# Patient Record
Sex: Male | Born: 1990 | Race: Black or African American | Hispanic: No | Marital: Single | Smoking: Light tobacco smoker
Health system: Southern US, Community
[De-identification: ages and names within clinical notes are randomized; demographics above are authoritative.]

---
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
## 2011-02-18 ENCOUNTER — Emergency Department (HOSPITAL_COMMUNITY)
Admission: EM | Admit: 2011-02-18 | Discharge: 2011-02-18 | Disposition: A | Discharge status: Home / Self Care | Payer: Self-pay | Attending: Emergency Medicine | Admitting: Emergency Medicine

## 2011-02-18 ENCOUNTER — Emergency Department (HOSPITAL_COMMUNITY): Payer: Self-pay

## 2011-02-18 DIAGNOSIS — R0789 Other chest pain: Secondary | ICD-10-CM | POA: Insufficient documentation

## 2012-08-20 ENCOUNTER — Emergency Department (HOSPITAL_COMMUNITY)
Admission: EM | Admit: 2012-08-20 | Discharge: 2012-08-21 | Disposition: A | Discharge status: Home / Self Care | Payer: Self-pay | Attending: Emergency Medicine | Admitting: Emergency Medicine

## 2012-08-20 ENCOUNTER — Emergency Department (HOSPITAL_COMMUNITY): Payer: Self-pay

## 2012-08-20 ENCOUNTER — Encounter (HOSPITAL_COMMUNITY): Payer: Self-pay

## 2012-08-20 DIAGNOSIS — R079 Chest pain, unspecified: Secondary | ICD-10-CM | POA: Insufficient documentation

## 2012-08-20 NOTE — ED Notes (Signed)
Pt states that he started having sudden onset of chest pain yesterday after work, describes it as chest tightness accompanied by inability to take deep breath, he states that the pain worsens with deep inspiration, Pt states that he also had this pain on Sunday while he was at the gym playing basketball and states "I felt my chest get tight and my heart racing and I had to stop because I couldn't take deep breaths."  Pt states that he has had this type of pain before and was seen a couple of years ago for it but they didn't find anything however he states that lately the pain has been happening everyday. Pt works for a [employer] but states that the pain doesn't feel like muscle pain.

## 2012-08-20 NOTE — ED Provider Notes (Signed)
History    21yM with CP. Intermittent for past week but worse today. Sharp/tight. Lasts seconds to up to a couple minutes. Worse with deep breaths. Does not radiate. No SOB. No n/v, palpitations or diaphoresis. No fever or chills. No cough. No unusual leg pain or swelling.   CSN: [id number]  Arrival date & time 08/20/12  2037   First MD Initiated Contact with Patient 08/20/12 2232      Chief Complaint  Patient presents with  . Chest Pain    (Consider location/radiation/quality/duration/timing/severity/associated sxs/prior treatment) HPI  History reviewed. No pertinent past medical history.  History reviewed. No pertinent past surgical history.  History reviewed. No pertinent family history.  History  Substance Use Topics  . Smoking status: Never Smoker   . Smokeless tobacco: Not on file  . Alcohol Use: Yes      Review of Systems   Review of symptoms negative unless otherwise noted in HPI.   Allergies  Review of patient's allergies indicates no known allergies.  Home Medications  No current outpatient prescriptions on file.  BP 137/63  Pulse 56  Temp 98.1 F (36.7 C) (Oral)  Resp 18  Ht 6\' 3"  (1.905 m)  SpO2 98%  Physical Exam  Nursing note and vitals reviewed. Constitutional: He appears well-developed and well-nourished. No distress.       Laying in bed. NAd.   HENT:  Head: Normocephalic and atraumatic.  Eyes: Conjunctivae normal are normal. Right eye exhibits no discharge. Left eye exhibits no discharge.  Neck: Neck supple.  Cardiovascular: Normal rate, regular rhythm and normal heart sounds.  Exam reveals no gallop and no friction rub.   No murmur heard. Pulmonary/Chest: Effort normal and breath sounds normal. No respiratory distress. He exhibits no tenderness.  Abdominal: Soft. He exhibits no distension. There is no tenderness.  Musculoskeletal: He exhibits no edema and no tenderness.       Lower extremities symmetric as compared to each other. No  calf tenderness. Negative Homan's. No palpable cords.   Neurological: He is alert.  Skin: Skin is warm and dry.  Psychiatric: He has a normal mood and affect. His behavior is normal. Thought content normal.    ED Course  Procedures (including critical care time)  Labs Reviewed - No data to display Dg Chest 2 View  08/20/2012  *RADIOLOGY REPORT*  Clinical Data: Chest pain beginning 08/17/2012.  CHEST - 2 VIEW  Comparison: PA and lateral chest 02/18/2011.  Findings: Lungs are clear.  Heart size is normal.  No pneumothorax or pleural fluid.  IMPRESSION: Negative chest.   Original Report Authenticated By: Bernadene Bell. D'ALESSIO, M.D.    EKG:  Rhythm: Vent. rate 61 BPM PR interval 144 ms QRS duration 86 ms QT/QTc 428/431 ms Axis: normal ST segments: diffuse STE, likely BER.  1. Chest pain       MDM  21yM with CP. Etiology unclear but doubt emergent. PERC negative. CXR clear. Doubt ACS. EKG with diffuse STE. Suspect BER. Consider pericarditis but doubt. Pt instructed to take NSAIDs for pain. Return precautions discussed. Outpt FU.         Raeford Razor, MD 08/23/12 2348

## 2013-08-30 ENCOUNTER — Encounter (HOSPITAL_COMMUNITY): Payer: Self-pay | Admitting: Emergency Medicine

## 2013-08-30 ENCOUNTER — Emergency Department (HOSPITAL_COMMUNITY)
Admission: EM | Admit: 2013-08-30 | Discharge: 2013-08-30 | Disposition: A | Discharge status: Home / Self Care | Payer: No Typology Code available for payment source | Attending: Emergency Medicine | Admitting: Emergency Medicine

## 2013-08-30 DIAGNOSIS — Y9389 Activity, other specified: Secondary | ICD-10-CM | POA: Insufficient documentation

## 2013-08-30 DIAGNOSIS — Y92410 Unspecified street and highway as the place of occurrence of the external cause: Secondary | ICD-10-CM | POA: Insufficient documentation

## 2013-08-30 DIAGNOSIS — Z043 Encounter for examination and observation following other accident: Secondary | ICD-10-CM | POA: Insufficient documentation

## 2013-08-30 MED ORDER — NAPROXEN 500 MG PO TABS: 500.0000 mg | ORAL_TABLET | Freq: Two times a day (BID) | ORAL | Status: DC

## 2013-08-30 MED ORDER — NAPROXEN 500 MG PO TABS
500.0000 mg | ORAL_TABLET | Freq: Once | ORAL | Status: AC
  Administered 2013-08-30: 500 mg via ORAL
  Filled 2013-08-30: qty 1

## 2013-08-30 MED ORDER — CYCLOBENZAPRINE HCL 10 MG PO TABS
10.0000 mg | ORAL_TABLET | Freq: Once | ORAL | Status: AC
  Administered 2013-08-30: 10 mg via ORAL
  Filled 2013-08-30: qty 1

## 2013-08-30 MED ORDER — CYCLOBENZAPRINE HCL 10 MG PO TABS: 10.0000 mg | ORAL_TABLET | Freq: Two times a day (BID) | ORAL | Status: DC | PRN
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
## 2013-08-30 NOTE — ED Provider Notes (Signed)
Medical screening examination/treatment/procedure(s) were performed by non-physician practitioner and as supervising physician I was immediately available for consultation/collaboration.  EKG Interpretation   None         Arieliz Latino, MD 08/30/13 2234 
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
## 2014-09-03 ENCOUNTER — Emergency Department (HOSPITAL_COMMUNITY): Payer: No Typology Code available for payment source

## 2014-09-03 ENCOUNTER — Emergency Department (HOSPITAL_COMMUNITY)
Admission: EM | Admit: 2014-09-03 | Discharge: 2014-09-03 | Disposition: A | Discharge status: Home / Self Care | Payer: No Typology Code available for payment source | Attending: Emergency Medicine | Admitting: Emergency Medicine

## 2014-09-03 ENCOUNTER — Encounter (HOSPITAL_COMMUNITY): Payer: Self-pay | Admitting: Emergency Medicine

## 2014-09-03 DIAGNOSIS — X58XXXA Exposure to other specified factors, initial encounter: Secondary | ICD-10-CM | POA: Insufficient documentation

## 2014-09-03 DIAGNOSIS — Y998 Other external cause status: Secondary | ICD-10-CM | POA: Insufficient documentation

## 2014-09-03 DIAGNOSIS — S93401A Sprain of unspecified ligament of right ankle, initial encounter: Secondary | ICD-10-CM

## 2014-09-03 DIAGNOSIS — Y92310 Basketball court as the place of occurrence of the external cause: Secondary | ICD-10-CM | POA: Insufficient documentation

## 2014-09-03 DIAGNOSIS — Z791 Long term (current) use of non-steroidal anti-inflammatories (NSAID): Secondary | ICD-10-CM | POA: Insufficient documentation

## 2014-09-03 DIAGNOSIS — Y9367 Activity, basketball: Secondary | ICD-10-CM | POA: Insufficient documentation

## 2014-09-03 MED ORDER — CYCLOBENZAPRINE HCL 10 MG PO TABS: 10.0000 mg | ORAL_TABLET | Freq: Two times a day (BID) | ORAL | Status: DC | PRN

## 2014-09-03 MED ORDER — IBUPROFEN 200 MG PO TABS: 800.0000 mg | ORAL_TABLET | Freq: Four times a day (QID) | ORAL | Status: DC | PRN

## 2014-09-03 MED ORDER — IBUPROFEN 800 MG PO TABS
800.0000 mg | ORAL_TABLET | Freq: Once | ORAL | Status: AC
  Administered 2014-09-03: 800 mg via ORAL
  Filled 2014-09-03: qty 1

## 2014-09-03 NOTE — ED Provider Notes (Signed)
CSN: [id number]     Arrival date & time 09/03/14  1242 History  This chart was scribed for non-physician practitioner, Fayrene HelperBowie Kanan Sobek, PA-C working with Flint MelterElliott L Wentz, MD by Luisa DagoPriscilla Tutu, ED scribe. This patient was seen in room WTR9/WTR9 and the patient's care was started at 2:10 PM.    Chief Complaint  Patient presents with  . Ankle Pain   The history is provided by the patient. No language interpreter was used.   HPI Comments: Jay Keith is a [age] y.o. male who presents to the Emergency Department complaining of sudden onset worsening right ankle pain. Pt states that he was playing basketball at 6 am when he inverted his right ankle when he was coming down from a jump shot. He states that the pain is exacerbated by bearing weight. He reports applying warm and cold compresses to the area, as well as taking Tylenol with no extended relief of his pain. Pt endorses previous ankle sprains but he has never had to get evaluated by a provider. Denies any hip pain, radiating pain, paraesthesia, numbness, or knee pain.    History reviewed. No pertinent past medical history. History reviewed. No pertinent past surgical history. No family history on file. History  Substance Use Topics  . Smoking status: Never Smoker   . Smokeless tobacco: Not on file  . Alcohol Use: Yes    Review of Systems  Constitutional: Negative for fever and chills.  HENT: Negative for congestion, ear discharge and sinus pressure.   Eyes: Negative for discharge.  Respiratory: Negative for cough.   Cardiovascular: Negative for chest pain.  Gastrointestinal: Negative for abdominal pain and diarrhea.  Genitourinary: Negative for frequency and hematuria.  Musculoskeletal: Positive for joint swelling and arthralgias. Negative for myalgias and back pain.  Skin: Negative for rash.  Neurological: Negative for seizures, weakness, numbness and headaches.  Psychiatric/Behavioral: Negative for hallucinations.   Allergies  Review of  patient's allergies indicates no known allergies.  Home Medications   Prior to Admission medications   Medication Sig Start Date End Date Taking? Authorizing Provider  cyclobenzaprine (FLEXERIL) 10 MG tablet Take 1 tablet (10 mg total) by mouth 2 (two) times daily as needed for muscle spasms. 08/30/13   Kaitlyn Szekalski, PA-C  naproxen (NAPROSYN) 500 MG tablet Take 1 tablet (500 mg total) by mouth 2 (two) times daily with a meal. 08/30/13   Kaitlyn Szekalski, PA-C   BP 140/75 mmHg  Pulse 62  Temp(Src) 98.6 F (37 C) (Oral)  Resp 16  SpO2 100%  Physical Exam  Constitutional: He appears well-developed and well-nourished. No distress.  HENT:  Head: Normocephalic and atraumatic.  Right Ear: External ear normal.  Left Ear: External ear normal.  Nose: Nose normal.  Mouth/Throat: Oropharynx is clear and moist.  Eyes: Conjunctivae are normal. Pupils are equal, round, and reactive to light. Right eye exhibits no discharge. Left eye exhibits no discharge.  Neck: Normal range of motion. Neck supple. No thyromegaly present.  Cardiovascular: Normal rate, regular rhythm and normal heart sounds.  Exam reveals no gallop and no friction rub.   No murmur heard. Pulmonary/Chest: Effort normal and breath sounds normal. No respiratory distress.  Abdominal: Soft. He exhibits no distension. There is no tenderness.  Musculoskeletal: He exhibits edema and tenderness.  Edema on the lateral, medial, and anterior aspect of his right ankle. No tenderness of calcaneous.  Lateral and medial malleoli are tender to palpation. Knee with normal extension and flexion.  Equal sensation bilaterally.  Lymphadenopathy:  He has no cervical adenopathy.  Neurological: He is alert. No cranial nerve deficit.  Skin: Skin is warm and dry.  Psychiatric: He has a normal mood and affect. His behavior is normal. Thought content normal.  Nursing note and vitals reviewed.   ED Course  Procedures (including critical care  time)  DIAGNOSTIC STUDIES: Oxygen Saturation is 100% on RA, normal by my interpretation.    COORDINATION OF CARE: 2:17 PM- Will order Ibuprofen for pain control. Pt will pt d/c with a right ankle brace and crutches for comfort. Advised pt to keep the ankle brace on and to elevate the affected ankle to reduce swelling. Also advised pt that if the pain persists after Monday, he should follow up with ortho on call. Pt advised of plan for treatment and pt agrees.  Imaging Review Dg Ankle Complete Right  09/03/2014   CLINICAL DATA:  Inversion injury while playing basketball with persistent lateral pain, initial encounter  EXAM: RIGHT ANKLE - COMPLETE 3+ VIEW  COMPARISON:  None.  FINDINGS: Considerable soft tissue swelling is noted particularly laterally. No acute fracture or dislocation is noted. No other focal abnormality is seen.  IMPRESSION: Soft tissue swelling without acute bony abnormality.   Electronically Signed   By: Alcide CleverMark  Lukens M.D.   On: 09/03/2014 13:18    MDM   Final diagnoses:  Right ankle sprain, initial encounter    BP 140/75 mmHg  Pulse 62  Temp(Src) 98.6 F (37 C) (Oral)  Resp 16  SpO2 100%  I have reviewed nursing notes and vital signs. I personally reviewed the imaging tests through PACS system  I reviewed available ER/hospitalization records thought the EMR   I personally performed the services described in this documentation, which was scribed in my presence. The recorded information has been reviewed and is accurate.    Fayrene HelperBowie Raybon Conard, PA-C 09/03/14 1420  Flint MelterElliott L Wentz, MD 09/03/14 [phone]

## 2014-09-03 NOTE — Discharge Instructions (Signed)

## 2014-09-03 NOTE — ED Notes (Signed)
Pt alert, nad, resp even unlabored, skin pwd, denies needs, ambulates to discharge 

## 2014-09-03 NOTE — ED Notes (Signed)
Pt reports turning right ankle this am playing basketball. Pt has iced ankle most of the morning. Swelling and bruising noted to right ankle and pt unable to ambulate.

## 2015-03-24 ENCOUNTER — Emergency Department (HOSPITAL_COMMUNITY)
Admission: EM | Admit: 2015-03-24 | Discharge: 2015-03-25 | Disposition: A | Discharge status: Home / Self Care | Payer: Self-pay | Attending: Emergency Medicine | Admitting: Emergency Medicine

## 2015-03-24 ENCOUNTER — Encounter (HOSPITAL_COMMUNITY): Payer: Self-pay | Admitting: *Deleted

## 2015-03-24 DIAGNOSIS — J0100 Acute maxillary sinusitis, unspecified: Secondary | ICD-10-CM | POA: Insufficient documentation

## 2015-03-24 MED ORDER — METOCLOPRAMIDE HCL 5 MG/ML IJ SOLN
10.0000 mg | Freq: Once | INTRAMUSCULAR | Status: AC
  Administered 2015-03-24: 10 mg via INTRAVENOUS
  Filled 2015-03-24: qty 2

## 2015-03-24 MED ORDER — KETOROLAC TROMETHAMINE 30 MG/ML IJ SOLN
30.0000 mg | Freq: Once | INTRAMUSCULAR | Status: AC
  Administered 2015-03-24: 30 mg via INTRAVENOUS
  Filled 2015-03-24: qty 1

## 2015-03-24 MED ORDER — AMOXICILLIN-POT CLAVULANATE 875-125 MG PO TABS
1.0000 | ORAL_TABLET | Freq: Once | ORAL | Status: AC
  Administered 2015-03-25: 1 via ORAL
  Filled 2015-03-24: qty 1

## 2015-03-24 MED ORDER — SODIUM CHLORIDE 0.9 % IV BOLUS (SEPSIS)
1000.0000 mL | Freq: Once | INTRAVENOUS | Status: AC
  Administered 2015-03-24: 1000 mL via INTRAVENOUS

## 2015-03-24 MED ORDER — DIPHENHYDRAMINE HCL 50 MG/ML IJ SOLN
12.5000 mg | Freq: Once | INTRAMUSCULAR | Status: AC
  Administered 2015-03-24: 12.5 mg via INTRAVENOUS
  Filled 2015-03-24: qty 1

## 2015-03-24 NOTE — ED Provider Notes (Signed)
CSN: [id number]     Arrival date & time 03/24/15  2151 History   First MD Initiated Contact with Patient 03/24/15 2314     Chief Complaint  Patient presents with  . Headache     (Consider location/radiation/quality/duration/timing/severity/associated sxs/prior Treatment) Patient is a 24 y.o. male presenting with headaches. The history is provided by the patient and medical records. No language interpreter was used.  Headache Associated symptoms: congestion, cough, drainage, ear pain and sinus pressure   Associated symptoms: no abdominal pain, no back pain, no diarrhea, no fatigue, no fever, no nausea, no neck stiffness and no vomiting     Jay Keith is a 24 y.o. male  with no major medical history presents to the Emergency Department complaining of gradual, persistent, progressively worsening left-sided facial pain and associated headache onset yesterday morning.  Patient reports the pain is rated at a 10/10 is described as pressure located in the left face which radiates to the back of the left side of the head. Associated symptoms include left otalgia, sinus congestion, purulent drainage from the left nare, left upper dental pain, chills.  Pt reports taking acetaminophen without relief. Nothing makes it better and coughing, bending over makes it worse.  Pt denies fever, neck pain, neck stiffness, chest pain, SOB, abd pain, N/V weakness, dizziness, syncope.   Pt reports approx 1 week of sinus drainage, cough and congestion which he thought was getting better.     History reviewed. No pertinent past medical history. History reviewed. No pertinent past surgical history. No family history on file. History  Substance Use Topics  . Smoking status: Never Smoker   . Smokeless tobacco: Not on file  . Alcohol Use: Yes    Review of Systems  Constitutional: Negative for fever, diaphoresis, appetite change, fatigue and unexpected weight change.  HENT: Positive for congestion, ear pain, postnasal  drip, rhinorrhea and sinus pressure. Negative for mouth sores.   Eyes: Negative for visual disturbance.  Respiratory: Positive for cough. Negative for chest tightness, shortness of breath and wheezing.   Cardiovascular: Negative for chest pain.  Gastrointestinal: Negative for nausea, vomiting, abdominal pain, diarrhea and constipation.  Endocrine: Negative for polydipsia, polyphagia and polyuria.  Genitourinary: Negative for dysuria, urgency, frequency and hematuria.  Musculoskeletal: Negative for back pain and neck stiffness.  Skin: Negative for rash.  Allergic/Immunologic: Negative for immunocompromised state.  Neurological: Positive for headaches. Negative for syncope and light-headedness.  Hematological: Does not bruise/bleed easily.  Psychiatric/Behavioral: Negative for sleep disturbance. The patient is not nervous/anxious.       Allergies  Review of patient's allergies indicates no known allergies.  Home Medications   Prior to Admission medications   Medication Sig Start Date End Date Taking? Authorizing Provider  ibuprofen (ADVIL,MOTRIN) 600 MG tablet Take 600 mg by mouth every 6 (six) hours as needed for headache.   Yes Historical Provider, MD  OVER THE COUNTER MEDICATION Take 2 tablets by mouth daily as needed (headache). "Pain Reliever"   Yes Historical Provider, MD  amoxicillin-clavulanate (AUGMENTIN) 875-125 MG per tablet Take 1 tablet by mouth every 12 (twelve) hours. 03/25/15   Shy Guallpa, PA-C  HYDROcodone-acetaminophen (NORCO/VICODIN) 5-325 MG per tablet Take 1-2 tablets by mouth every 6 (six) hours as needed for moderate pain or severe pain. 03/25/15   Ahlayah Tarkowski, PA-C   BP 124/73 mmHg  Pulse 64  Temp(Src) 98.3 F (36.8 C) (Oral)  Resp 16  Ht  (1.905 m)  Wt 200 lb (90.719 kg)  BMI 25.00  kg/m2  SpO2 97% Physical Exam  Constitutional: He is oriented to person, place, and time. He appears well-developed and well-nourished. No distress.  HENT:   Head: Normocephalic and atraumatic.  Right Ear: Tympanic membrane, external ear and ear canal normal.  Left Ear: Tympanic membrane, external ear and ear canal normal.  Nose: Mucosal edema and rhinorrhea present. No epistaxis. Right sinus exhibits no maxillary sinus tenderness and no frontal sinus tenderness. Left sinus exhibits maxillary sinus tenderness. Left sinus exhibits no frontal sinus tenderness.  Mouth/Throat: Uvula is midline, oropharynx is clear and moist and mucous membranes are normal. Mucous membranes are not pale and not cyanotic. No oropharyngeal exudate, posterior oropharyngeal edema, posterior oropharyngeal erythema or tonsillar abscesses.  Significant left maxillary sinus tenderness; purulent discharge from the left nare  Eyes: Conjunctivae and EOM are normal. Pupils are equal, round, and reactive to light. No scleral icterus.  No horizontal, vertical or rotational nystagmus  Neck: Normal range of motion and full passive range of motion without pain. Neck supple.  Full active and passive ROM without pain No midline or paraspinal tenderness No nuchal rigidity or meningeal signs  Cardiovascular: Normal rate, regular rhythm and intact distal pulses.   Pulmonary/Chest: Effort normal and breath sounds normal. No stridor. No respiratory distress. He has no wheezes. He has no rales.  Clear and equal breath sounds without focal wheezes, rhonchi, rales  Abdominal: Soft. Bowel sounds are normal. There is no tenderness. There is no rebound and no guarding.  Musculoskeletal: Normal range of motion.  Lymphadenopathy:    He has no cervical adenopathy.  Neurological: He is alert and oriented to person, place, and time. He has normal reflexes. No cranial nerve deficit. He exhibits normal muscle tone. Coordination normal.  Mental Status:  Alert, oriented, thought content appropriate. Speech fluent without evidence of aphasia. Able to follow 2 step commands without difficulty.  Cranial  Nerves:  II:  Peripheral visual fields grossly normal, pupils equal, round, reactive to light III,IV, VI: ptosis not present, extra-ocular motions intact bilaterally  V,VII: smile symmetric, facial light touch sensation equal VIII: hearing grossly normal bilaterally  IX,X: gag reflex present  XI: bilateral shoulder shrug equal and strong XII: midline tongue extension  Motor:  5/5 in upper and lower extremities bilaterally including strong and equal grip strength and dorsiflexion/plantar flexion Sensory: Pinprick and light touch normal in all extremities.  Deep Tendon Reflexes: 2+ and symmetric  Cerebellar: normal finger-to-nose with bilateral upper extremities Gait: normal gait and balance CV: distal pulses palpable throughout   Skin: Skin is warm and dry. No rash noted. He is not diaphoretic.  Psychiatric: He has a normal mood and affect. His behavior is normal. Judgment and thought content normal.  Nursing note and vitals reviewed.   ED Course  Procedures (including critical care time) Labs Review Labs Reviewed - No data to display  Imaging Review No results found.   EKG Interpretation None      MDM   Final diagnoses:  Acute maxillary sinusitis, recurrence not specified   Roderic OvensGuy Buerkle presents with symptoms of acute sinusitis. Pt with headache, nonfocal neurologic exam.  Will give pain control, fluids and reassess.    12:56 AM Pt with significant improvement in his headache.  Reports he is feeling better.    Symptoms have been present for greater than 10 days with lateralization in the last few, purulent nasal discharge and maxillary sinus pain.  Concern for acute bacterial rhinosinusitis.  Patient discharged with Augmentin.  Instructions given for  warm saline nasal wash and recommendations for follow-up with primary care physician.  Vicodin for pain  BP 124/73 mmHg  Pulse 64  Temp(Src) 98.3 F (36.8 C) (Oral)  Resp 16  Ht  (1.905 m)  Wt 200 lb (90.719 kg)  BMI  25.00 kg/m2  SpO2 97%   Dierdre Forth, PA-C 03/25/15 0059  Arby Barrette, MD 03/31/15 [phone]

## 2015-03-24 NOTE — ED Notes (Signed)
Pt c/o HA since yesterday. States he has taken ibuprofen with some relief. Reports pain in the eyes.

## 2015-03-25 MED ORDER — AMOXICILLIN-POT CLAVULANATE 875-125 MG PO TABS: 1.0000 | ORAL_TABLET | Freq: Two times a day (BID) | ORAL | Status: DC

## 2015-03-25 MED ORDER — HYDROCODONE-ACETAMINOPHEN 5-325 MG PO TABS: 1.0000 | ORAL_TABLET | Freq: Four times a day (QID) | ORAL | Status: DC | PRN

## 2015-03-25 MED ORDER — SODIUM CHLORIDE 0.9 % IV BOLUS (SEPSIS)
500.0000 mL | Freq: Once | INTRAVENOUS | Status: AC
  Administered 2015-03-25: 500 mL via INTRAVENOUS

## 2015-03-25 NOTE — Discharge Instructions (Signed)
1. Medications: Augmentin, usual home medications 2. Treatment: rest, drink plenty of fluids,  3. Follow Up: Please followup with your primary doctor in 2-3 days for discussion of your diagnoses and further evaluation after today's visit; if you do not have a primary care doctor use the resource guide provided to find one; Please return to the ER for worsening symptoms   Sinusitis Sinusitis is redness, soreness, and inflammation of the paranasal sinuses. Paranasal sinuses are air pockets within the bones of your face (beneath the eyes, the middle of the forehead, or above the eyes). In healthy paranasal sinuses, mucus is able to drain out, and air is able to circulate through them by way of your nose. However, when your paranasal sinuses are inflamed, mucus and air can become trapped. This can allow bacteria and other germs to grow and cause infection. Sinusitis can develop quickly and last only a short time (acute) or continue over a long period (chronic). Sinusitis that lasts for more than 12 weeks is considered chronic.  CAUSES  Causes of sinusitis include:  Allergies.  Structural abnormalities, such as displacement of the cartilage that separates your nostrils (deviated septum), which can decrease the air flow through your nose and sinuses and affect sinus drainage.  Functional abnormalities, such as when the small hairs (cilia) that line your sinuses and help remove mucus do not work properly or are not present. SIGNS AND SYMPTOMS  Symptoms of acute and chronic sinusitis are the same. The primary symptoms are pain and pressure around the affected sinuses. Other symptoms include:  Upper toothache.  Earache.  Headache.  Bad breath.  Decreased sense of smell and taste.  A cough, which worsens when you are lying flat.  Fatigue.  Fever.  Thick drainage from your nose, which often is green and may contain pus (purulent).  Swelling and warmth over the affected sinuses. DIAGNOSIS    Your health care provider will perform a physical exam. During the exam, your health care provider may:  Look in your nose for signs of abnormal growths in your nostrils (nasal polyps).  Tap over the affected sinus to check for signs of infection.  View the inside of your sinuses (endoscopy) using an imaging device that has a light attached (endoscope). If your health care provider suspects that you have chronic sinusitis, one or more of the following tests may be recommended:  Allergy tests.  Nasal culture. A sample of mucus is taken from your nose, sent to a lab, and screened for bacteria.  Nasal cytology. A sample of mucus is taken from your nose and examined by your health care provider to determine if your sinusitis is related to an allergy. TREATMENT  Most cases of acute sinusitis are related to a viral infection and will resolve on their own within 10 days. Sometimes medicines are prescribed to help relieve symptoms (pain medicine, decongestants, nasal steroid sprays, or saline sprays).  However, for sinusitis related to a bacterial infection, your health care provider will prescribe antibiotic medicines. These are medicines that will help kill the bacteria causing the infection.  Rarely, sinusitis is caused by a fungal infection. In theses cases, your health care provider will prescribe antifungal medicine. For some cases of chronic sinusitis, surgery is needed. Generally, these are cases in which sinusitis recurs more than 3 times per year, despite other treatments. HOME CARE INSTRUCTIONS   Drink plenty of water. Water helps thin the mucus so your sinuses can drain more easily.  Use a humidifier.  Inhale steam 3 to 4 times a day (for example, sit in the bathroom with the shower running).  Apply a warm, moist washcloth to your face 3 to 4 times a day, or as directed by your health care provider.  Use saline nasal sprays to help moisten and clean your sinuses.  Take medicines  only as directed by your health care provider.  If you were prescribed either an antibiotic or antifungal medicine, finish it all even if you start to feel better. SEEK IMMEDIATE MEDICAL CARE IF:  You have increasing pain or severe headaches.  You have nausea, vomiting, or drowsiness.  You have swelling around your face.  You have vision problems.  You have a stiff neck.  You have difficulty breathing. MAKE SURE YOU:   Understand these instructions.  Will watch your condition.  Will get help right away if you are not doing well or get worse. Document Released: 10/08/2005 Document Revised: 02/22/2014 Document Reviewed: 10/23/2011 [facility] Patient Information 2015 [employer], [place]. This information is not intended to replace advice given to you by your health care provider. Make sure you discuss any questions you have with your health care provider.    Emergency Department Resource Guide 1) Find a Doctor and Pay Out of Pocket Although you won't have to find out who is covered by your insurance plan, it is a good idea to ask around and get recommendations. You will then need to call the office and see if the doctor you have chosen will accept you as a new patient and what types of options they offer for patients who are self-pay. Some doctors offer discounts or will set up payment plans for their patients who do not have insurance, but you will need to ask so you aren't surprised when you get to your appointment.  2) Contact Your Local Health Department Not all health departments have doctors that can see patients for sick visits, but many do, so it is worth a call to see if yours does. If you don't know where your local health department is, you can check in your phone book. The CDC also has a tool to help you locate your state's health department, and many state websites also have listings of all of their local health departments.  3) Find a Walk-in Clinic If your illness is not  likely to be very severe or complicated, you may want to try a walk in clinic. These are popping up all over the country in pharmacies, drugstores, and shopping centers. They're usually staffed by nurse practitioners or physician assistants that have been trained to treat common illnesses and complaints. They're usually fairly quick and inexpensive. However, if you have serious medical issues or chronic medical problems, these are probably not your best option.  No Primary Care Doctor: - Call Health Connect at  [phone] - they can help you locate a primary care doctor that  accepts your insurance, provides certain services, etc. - Physician Referral Service- [phone]  Chronic Pain Problems: Organization         Address  Phone   Notes  [facility]  [phone]) [phone] Patients need to be referred by their primary care doctor.   Medication Assistance: Organization         Address  Phone   Notes  [facility] Pcssistance Program [address] [place], [place] [phone] --Must be a resident of [facility] [place] -- Must have NO insurance coverage whatsoever (no  Medicaid/ Medicare, etc.) -- The pt. MUST have a primary care doctor that directs their care regularly and follows them in the community   [facility]ist  [phone]   [employer]  [phone]    Agencies that provide inexpensive medical care: Organization         Address  Phone   Notes  [facility]  [phone]   [facility]    [phone]   [facility] [address] [place], [place] [address] [phone]   [facility] [address] [place], [place] [phone]   Planned Parenthood    [phone]   [facility]    [phone]   [facility]  [address], [place] Phone:  [phone], Fax:  [phone] Hours of Operation:  9 am - 6 pm,  M-F.  Also accepts Medicaid/Medicare and self-pay.  [facility]  [address], [place] Phone: [phone], Fax: [phone]. Hours of Operation:  8:30 am - 5:30 pm, M-F.  Also accepts Medicaid and self-pay.  [facility] [place] [address] [place] Phone: [phone]   [facility] [address] Natasha Bence [place], [place] [phone], Ext. [phone] Mondays & Thursdays: 7-9 AM.  First 15 patients are seen on a first come, first serve basis.    Medicaid-accepting [facility] Providers:  Organization         Address  Phone   Notes  [facility] [address],  [phone] Also accepts self-pay patients.  [facility] [address] Laurell Josephs [place], [place]  [phone]   [facility] [address], [place] [phone]   [facility] [address], [place] [phone]   Renaye Rakers [address], [place]   [phone] Only accepts [place] patients after they have their name applied to their card.   Self-Pay (no insurance) in [facility]:  Organization         Address  Phone   Notes  Sickle Cell Patients, [facility] [address] [place], [place] [phone]   [facility] [address] [place], [place] [phone]   [facility] [place]  [address], [place] [phone]   [facility]/Dr. Osei-Bonsu  [address], [place] or [address], [place] [phone] Phone number for both [place] and [place] locations is the same.  [facility] [address] [place] [phone]   [facility] [address] [place] or [address] [phone]   [facility] [address], [place] [phone], phone; [phone], fax Sees patients 1st and 3rd Saturday of every month.  Must not qualify for public or private insurance (i.e. Medicaid, Medicare, [place] Health Choice, Veterans' Benefits)  Household income should be no more than 200% of the poverty level The clinic cannot treat you if you are pregnant or think you are pregnant  Sexually transmitted diseases are not treated at the clinic.    Dental Care: Organization         Address  Phone  Notes  [facility] Department of [employer]  [facility] [address] [place], [place] [phone] Accepts children up to age 70 who are enrolled in [place] or [place] Health Choice; pregnant women with a Medicaid card; and children who have applied for Medicaid or [place] Health Choice, but were declined, whose parents can pay a reduced fee at time of service.  [facility] of [facility]  [address], [place] [phone] Accepts children up to age 78 who are enrolled in [place] or [place] Health Choice; pregnant women with a Medicaid card; and children who have applied for Medicaid or [place] Health Choice, but were declined, whose parents can pay a reduced fee at time of service.  [facility]  [address] [place], [place] [phone] Patients are seen by appointment only. Walk-ins are not accepted. [facility] will see patients 27 years of age and older. Monday - Tuesday (8am-5pm) Most Wednesdays (8:30-5pm) $30 per visit, cash only  [facility] Access PRO[facility]  [address], [facility] [phone] Patients are seen by appointment only. Walk-ins are not accepted. [facility] will see patients 71 years of age and older. One Wednesday Evening (Monthly: Volunteer Based).  $30 per visit, cash only  [employer]  [phone] for adults; Children under age 65, call Graduate Pediatric Dentistry at [phone]. Children aged 73-14, please call [phone] to request a pediatric application.  Dental services are provided in all areas of dental care including fillings, crowns and bridges, complete and partial dentures, implants, gum treatment, root canals, and extractions. Preventive care is also provided. Treatment is provided to both adults and children. Patients are selected via a lottery and there is often a waiting list.   [facility] [address], [place]  [phone] [email or web address]   [facility] [address] [place], [place] [phone], Ext. [phone] Second and Fourth Thursday of each month, opens at 6:30 AM; Clinic ends at 9 AM.  Patients are seen on a first-come first-served basis, and a limited number are seen during each clinic.   [facility]  [address] Ether [place]ins [place], [place] [phone]   Eligibility Requirements You must have lived in [place], [place], or [place] counties for at least the last three months.   You cannot be eligible for state or federal sponsored [place], including [employer], [place], or [employer].   You generally cannot be eligible for healthcare insurance through your employer.    How to apply: Eligibility screenings are held every Tuesday and Wednesday afternoon from 1:00 pm until 4:00 pm. You do not need an appointment for the interview!  [facility] [address], [place], [place] [phone]   [facility] Health Department  [phone]   [facility] Health Department  [phone]   [facility] Health Department  [phone]    Behavioral Health Resources in the Community: Intensive Outpatient Programs Organization         Address  Phone  Notes  [facility] [address], [place], [place] [phone]   [facility] [address], [place], [place] [phone]   [facility]  [address] [place], [place]  [phone]   [facility] Mental Health [address]  [place], [place] [phone] or [phone]   Substance Abuse Resources Organization         Address  Phone  Notes  [facility] Drug Services  [phone]   [facility]  [phone]   [facility] [place]  [phone]   Floydene Flock  [phone]   Residential & Outpatient Substance Abuse Program  [phone]   Psychological Services Organization         Address  Phone  Notes  [facility] Behavioral Health  [phone]   [facility] Services  [phone]   [facility] Mental Health [address] [place] [phone] or [phone]    Mobile Crisis Teams Organization         Address  Phone  Notes  [facility], Mobile Crisis Care Unit  [phone]   [facility]  [address] [place], [place] [phone]   Doristine Locks [address] [place] [phone]    Self-Help/Support Groups Organization         Address  Phone             Notes  [facility] [place] - variety of support groups  336- I7437963 Call for more information  Narcotics Anonymous (NA), [facility] [address], [employer] [place]  2 meetings at this location   Statistician         Address  Phone  Notes  [facility] [address],    [place]  [phone]   [facility]  [address] [place] [address], [place], [place] [phone]   [facility] [address] [place], [place] [phone] Admissions: 8am-3pm M-F  [facility] [address],    [place], [place] [phone]   [facility] [address] [place], [place], [place] [phone]   [facility] [address],  [place], [place] [phone]   [facility] - Intensive Outpatient [address] Laurell Joseph[address], [place], [place]  [phone]   [facility] ([facility] [address] [place]  [place], [place] [phone] or [phone]   [facility] ([facility]) [address] [place], [place] [phone] Accepts Medicaid  [facility] [place] [address],  [place] [phone] Substance Abuse/Addiction Treatment   [facility] Organization         Address  Phone  Notes  [facility]  [phone]   Angie Fava, PhD [address] Ervin Knack [place], [place]   [phone] or [phone]   [facility] Behavioral   [address] [place], [place] [phone]   [facility] [address] [place], [place] [phone] Insurance/Medicaid/sponsorship through [facility] and Families [address]                                    [place], [place] [phone] Therapy/tele-psych/case  [facility] [address], [place] [phone]    Dr. Lolly Mustache  [phone]   [facility]  [employer] [facility] Dept. 1) [address] [place] 2) [address] [place] 3)  [address], [place] [phone]  [phone]   [facility] Child Abuse Hotline [phone]) 490-8622 or [phone] (After Hours)

## 2017-01-27 ENCOUNTER — Emergency Department (HOSPITAL_COMMUNITY)
Admission: EM | Admit: 2017-01-27 | Discharge: 2017-01-27 | Disposition: A | Discharge status: Home / Self Care | Payer: Self-pay | Attending: Emergency Medicine | Admitting: Emergency Medicine

## 2017-01-27 ENCOUNTER — Encounter (HOSPITAL_COMMUNITY): Payer: Self-pay | Admitting: Emergency Medicine

## 2017-01-27 ENCOUNTER — Emergency Department (HOSPITAL_COMMUNITY): Payer: Self-pay

## 2017-01-27 DIAGNOSIS — Y929 Unspecified place or not applicable: Secondary | ICD-10-CM | POA: Insufficient documentation

## 2017-01-27 DIAGNOSIS — Y9367 Activity, basketball: Secondary | ICD-10-CM | POA: Insufficient documentation

## 2017-01-27 DIAGNOSIS — M25461 Effusion, right knee: Secondary | ICD-10-CM | POA: Insufficient documentation

## 2017-01-27 DIAGNOSIS — Y999 Unspecified external cause status: Secondary | ICD-10-CM | POA: Insufficient documentation

## 2017-01-27 DIAGNOSIS — W1839XA Other fall on same level, initial encounter: Secondary | ICD-10-CM | POA: Insufficient documentation

## 2017-01-27 DIAGNOSIS — S8991XA Unspecified injury of right lower leg, initial encounter: Secondary | ICD-10-CM

## 2017-01-27 DIAGNOSIS — Z79899 Other long term (current) drug therapy: Secondary | ICD-10-CM | POA: Insufficient documentation

## 2017-01-27 MED ORDER — HYDROCODONE-ACETAMINOPHEN 7.5-325 MG PO TABS: 1.0000 | ORAL_TABLET | ORAL | 0 refills | Status: DC | PRN

## 2017-01-27 MED ORDER — IBUPROFEN 600 MG PO TABS: 600.0000 mg | ORAL_TABLET | Freq: Four times a day (QID) | ORAL | 0 refills | Status: DC

## 2017-01-27 MED ORDER — OXYCODONE-ACETAMINOPHEN 5-325 MG PO TABS
2.0000 | ORAL_TABLET | Freq: Once | ORAL | Status: AC
  Administered 2017-01-27: 2 via ORAL
  Filled 2017-01-27: qty 2

## 2017-01-27 MED ORDER — ONDANSETRON HCL 4 MG PO TABS
4.0000 mg | ORAL_TABLET | Freq: Once | ORAL | Status: AC
  Administered 2017-01-27: 4 mg via ORAL
  Filled 2017-01-27: qty 1

## 2017-01-27 NOTE — ED Notes (Signed)
Pt was instructed not to drive for next 4 hours due to taking pain med that causes drowsiness

## 2017-01-27 NOTE — ED Provider Notes (Signed)
[facility]-EMERGENCY DEPT Provider Note   CSN: [id number] Arrival date & time: 01/27/17  1808  By signing my name below, I, [name], attest that this documentation has been prepared under the direction and in the presence of [employer].  Electronically Signed: [name], ED Scribe. 01/27/17. 6:47 PM.   History   Chief Complaint Chief Complaint  Patient presents with  . Knee Injury    HPI Jay Keith is a 25 y.o. male who presents to the Emergency Department complaining of sudden, severe, right knee pain that started earlier today. Pt notes he was playing basketball and landed wrong on his right leg causing pain to his knee. Pt notes he didn't try to walk after because of the pain. Ice alleviates his pain. No previous surgeries. Pt denies swelling or any other sx at this time.  The history is provided by the patient. No language interpreter was used.    History reviewed. No pertinent past medical history.  There are no active problems to display for this patient.   History reviewed. No pertinent surgical history.     Home Medications    Prior to Admission medications   Medication Sig Start Date End Date Taking? Authorizing Provider  amoxicillin-clavulanate (AUGMENTIN) 875-125 MG per tablet Take 1 tablet by mouth every 12 (twelve) hours. 03/25/15   Hannah Muthersbaugh, PA-C  HYDROcodone-acetaminophen (NORCO/VICODIN) 5-325 MG per tablet Take 1-2 tablets by mouth every 6 (six) hours as needed for moderate pain or severe pain. 03/25/15   Hannah Muthersbaugh, PA-C  ibuprofen (ADVIL,MOTRIN) 600 MG tablet Take 600 mg by mouth every 6 (six) hours as needed for headache.    Historical Provider, MD  OVER THE COUNTER MEDICATION Take 2 tablets by mouth daily as needed (headache). "Pain Reliever"    Historical Provider, MD    Family History History reviewed. No pertinent family history.  Social History Social History  Substance Use Topics  . Smoking status: Never Smoker  .  Smokeless tobacco: Not on file  . Alcohol use Yes     Allergies   Patient has no known allergies.   Review of Systems Review of Systems  Musculoskeletal: Positive for arthralgias. Negative for joint swelling.  All other systems reviewed and are negative.    Physical Exam Updated Vital Signs BP 132/63 (BP Location: Right Arm)   Pulse 66   Temp 98.9 F (37.2 C) (Oral)   Resp 18   Ht  (1.905 m)   Wt 200 lb (90.7 kg)   SpO2 100%   BMI 25.00 kg/m   Physical Exam  Constitutional: He is oriented to person, place, and time. He appears well-developed and well-nourished. No distress.  HENT:  Head: Normocephalic and atraumatic.  Eyes: Conjunctivae are normal.  Neck: Normal range of motion.  Cardiovascular: Normal rate, regular rhythm and normal heart sounds.   Pulmonary/Chest: Effort normal and breath sounds normal.  Abdominal: He exhibits no distension.  Musculoskeletal: Normal range of motion.  Pain to palapation to right quadricept area, but no palpable hematoma.  Pain to the lateral aspect of the right knee. Patella is midline. Pain at the anterior tibial tuberosity but no hematoma.  Good ROM to right ankle and toes.  Pain of posterior portion of the right knee.  Neurological: He is alert and oriented to person, place, and time.  Skin: No pallor.  Psychiatric: He has a normal mood and affect. His behavior is normal.  Nursing note and vitals reviewed.    ED Treatments /  Results  DIAGNOSTIC STUDIES: Oxygen Saturation is 100% on RA, normal by my interpretation.   COORDINATION OF CARE: 6:23 PM-Discussed next steps with pt. Pt verbalized understanding and is agreeable with the plan.    Labs (all labs ordered are listed, but only abnormal results are displayed) Labs Reviewed - No data to display  EKG  EKG Interpretation None       Radiology No results found.  Procedures Procedures (including critical care time)  Medications Ordered in  ED Medications  oxyCODONE-acetaminophen (PERCOCET/ROXICET) 5-325 MG per tablet 2 tablet (2 tablets Oral Given 01/27/17 1828)  ondansetron (ZOFRAN) tablet 4 mg (4 mg Oral Given 01/27/17 1828)     Initial Impression / Assessment and Plan / ED Course  I have reviewed the triage vital signs and the nursing notes.  Pertinent labs & imaging results that were available during my care of the patient were reviewed by me and considered in my medical decision making (see chart for details).     **I have reviewed nursing notes, vital signs, and all appropriate lab and imaging results for this patient.*  MDM  Pt came down in an awkward angle while playing basketball and injured the right knee. Having a great deal of pain. Examination limited because of pain. Pt to have X-ray of the right knee. Oral pain medication given.   7:02 PM X-ray of the right knee is negative for fracture or dislocation. There is noted suprapatella  knee effusion.    7:30 PM Pain improved after pain medication, but right knee is still painful to palpation and movement. Reexamination reveals both medial and lateral knee pain. Patella remains midline. No significant effusion appreciated  Discussed findings of the X-ray and the exam with the pt. Pt will be placed in a knee immobilizer and crutches. We will provide an ice pack. Prescription for ibuprofen QID. Norco every four hours for pain. We discussed ice, elevation and using crutches until cleared by an orthopedic specialist.   Final Clinical Impressions(s) / ED Diagnoses   Final diagnoses:  Effusion of right knee  Injury of right knee, initial encounter    New Prescriptions New Prescriptions   HYDROCODONE-ACETAMINOPHEN (NORCO) 7.5-325 MG TABLET    Take 1 tablet by mouth every 4 (four) hours as needed.   IBUPROFEN (ADVIL,MOTRIN) 600 MG TABLET    Take 1 tablet (600 mg total) by mouth 4 (four) times daily.   **I personally performed the services described in this  documentation, which was scribed in my presence. The recorded information has been reviewed and is accurate.Ivery Quale, PA-C 01/27/17 1946    Bethann Berkshire, MD 01/27/17 [phone]

## 2017-01-27 NOTE — ED Triage Notes (Signed)
Pt reports going up for a layup playing basketball and came down sideways on right knee.  No swelling or deformity noted.

## 2017-01-27 NOTE — ED Notes (Signed)
Pt verbalized understanding of no driving and to use caution within 4 hours of taking pain meds due to meds cause drowsiness 

## 2017-01-27 NOTE — Discharge Instructions (Signed)
The x-ray of your knee is negative for fracture or dislocation. There is a small amount of fluid in the knee joint area. Please use the knee immobilizer and crutches until seen by the orthopedic specialist. Please keep your knee elevated above your waist, and apply ice. Use 600 mg of ibuprofen with breakfast, lunch, dinner, and at bedtime. May use Norco for pain if needed. This medication may cause drowsiness, please use with caution.

## 2017-01-27 NOTE — ED Notes (Signed)
Patient transported to X-ray 

## 2017-01-31 ENCOUNTER — Telehealth: Payer: Self-pay | Admitting: Orthopaedic Surgery

## 2017-01-31 NOTE — Telephone Encounter (Signed)
Patient called today stating he went to the ED on Sunday, 01-27-17.  He stated he needed an appointment with Dr. Romeo Apple.  I checked out the note from the ED and it did state for him to call ASAP.  When I asked him why didn't he call sooner, he didn't have an answer.  Dr. Romeo Apple did not have any openings for tomorrow, 02-02-17 so I have him an appointment for Monday, 02-04-17 at 3:00.

## 2017-02-04 ENCOUNTER — Encounter: Payer: Self-pay | Admitting: Orthopedic Surgery

## 2017-02-04 ENCOUNTER — Ambulatory Visit (INDEPENDENT_AMBULATORY_CARE_PROVIDER_SITE_OTHER): Payer: Self-pay | Admitting: Orthopedic Surgery

## 2017-02-04 VITALS — BP 120/73 | HR 78 | Ht 76.0 in | Wt 211.0 lb

## 2017-02-04 DIAGNOSIS — S83511A Sprain of anterior cruciate ligament of right knee, initial encounter: Secondary | ICD-10-CM

## 2017-02-04 DIAGNOSIS — M2201 Recurrent dislocation of patella, right knee: Secondary | ICD-10-CM

## 2017-02-04 MED ORDER — IBUPROFEN 800 MG PO TABS: 800.0000 mg | ORAL_TABLET | Freq: Three times a day (TID) | ORAL | 1 refills | Status: DC | PRN

## 2017-02-04 MED ORDER — HYDROCODONE-ACETAMINOPHEN 5-325 MG PO TABS: 1.0000 | ORAL_TABLET | Freq: Four times a day (QID) | ORAL | 0 refills | Status: DC | PRN

## 2017-02-04 NOTE — Patient Instructions (Addendum)
ICE 3 X A DAY   WEAR BRACE FOR WALKING   NO WORK X 2 WEEKS

## 2017-02-04 NOTE — Progress Notes (Signed)
Patient ID: Jay Keith, male   DOB: 05/15/1991, 26 y.o.   MRN: [id number]  Chief Complaint  Patient presents with  . Knee Injury    RIGHT KNEE INJURY, DOI 01/27/17    HPI Jay Keith is a 26 y.o. male.  Presents after playing basketball landing on his right knee popped he thinks it popped back in complains of pain swelling loss of motion and inability to walk  Review of Systems Review of Systems No chest pain shortness of breath numbness tingling No past medical history on file. He reported no history of hypertension diabetes No past surgical history on file. He reported no history of surgery No family history on file. Reported no history of diabetes or hypertension in his family Social History Social History  Substance Use Topics  . Smoking status: Never Smoker  . Smokeless tobacco: Never Used  . Alcohol use Yes    No Known Allergies  Current Outpatient Prescriptions  Medication Sig Dispense Refill  . HYDROcodone-acetaminophen (NORCO) 7.5-325 MG tablet Take 1 tablet by mouth every 4 (four) hours as needed. 15 tablet 0  . ibuprofen (ADVIL,MOTRIN) 600 MG tablet Take 1 tablet (600 mg total) by mouth 4 (four) times daily. 30 tablet 0   No current facility-administered medications for this visit.        Physical Exam  BP 120/73   Pulse 78   Ht  (1.93 m)   Wt 211 lb (95.7 kg)   BMI 25.68 kg/m   Physical Exam  Constitutional: He is oriented to person, place, and time. He appears well-developed and well-nourished. No distress.  Cardiovascular: Normal rate and intact distal pulses.   Neurological: He is alert and oriented to person, place, and time.  Skin: Skin is warm and dry. No rash noted. He is not diaphoretic. No erythema. No pallor.  Psychiatric: He has a normal mood and affect. His behavior is normal. Judgment and thought content normal.    Ortho Exam  Gait: He is walking with a brace a knee immobilizer and crutches he is limping  His right knee is swollen his  left knee has normal his left knee has full range of motion his right knee has 30 of flexion and painful extension  The right knee is tender over the medial retinaculum and medial joint line  His anterior cruciate ligament felt stable but exam was difficult.  The left knee was examined for comparison there is no tenderness or swelling range of motion was full ligaments were stable strength is normal skin was intact he had a good distal pulse  On the right side sensation and pulses were normal muscle tone was normal skin was without abrasion   Data Reviewed X-rays were negative except for fusion  Assessment  Differential diagnosis patellar subluxation dislocation versus anterior cruciate ligament tear  Encounter Diagnoses  Name Primary?  . Rupture of anterior cruciate ligament of right knee, initial encounter Yes  . Recurrent dislocation of right patella     Plan  Ace bandage  Recommend ice elevation protected weightbearing in a brace  Follow-up in 2 weeks for repeat examination  Patient is uninsured and can't afford MRI  Fuller Canada, MD 02/04/2017 4:11 PM

## 2017-02-18 ENCOUNTER — Ambulatory Visit: Payer: Self-pay | Admitting: Orthopedic Surgery

## 2017-02-19 ENCOUNTER — Encounter: Payer: Self-pay | Admitting: Orthopedic Surgery

## 2017-03-05 ENCOUNTER — Encounter: Payer: Self-pay | Admitting: Orthopedic Surgery

## 2017-03-05 ENCOUNTER — Ambulatory Visit (INDEPENDENT_AMBULATORY_CARE_PROVIDER_SITE_OTHER): Payer: Self-pay | Admitting: Orthopedic Surgery

## 2017-03-05 DIAGNOSIS — M25661 Stiffness of right knee, not elsewhere classified: Secondary | ICD-10-CM

## 2017-03-05 MED ORDER — IBUPROFEN 800 MG PO TABS: 800.0000 mg | ORAL_TABLET | Freq: Three times a day (TID) | ORAL | 1 refills | Status: AC | PRN

## 2017-03-05 NOTE — Patient Instructions (Addendum)
Work on knee exercises   Give out of work note  Knee Rehabilitation Guidelines  After knee surgery, it is important to follow instructions from your health care provider about range-of-motion (ROM) and muscle strengthening exercises. This will improve your surgery results. If the exercises cause you to have pain or swelling in your knee joint, do them less often until you can do them without pain. Then, slowly increase how often you do your exercises. If you have problems or questions, talk with your health care provider or physical therapist. You should start exercising as soon as your health care provider or physical therapist says it is okay. Follow these instructions at home: Activity   Use your crutches orwalker as told by your health care provider.  Do not lift anything that is heavier than 10 lb (4.5 kg) and do not play contact sports until your health care provider says it is okay.  Return to your normal activities as told by your health care provider. Ask your health care provider what activities are safe for you.  Return to work as told by your health care provider.  Do not drive a car for six weeks or as told by your health care provider. General instructions   Take over-the-counter and prescription medicines only as told by your health care provider.  Protect your knee during the recovery period to keep it from getting injured again.  You may take sponge baths. Do not take showers or tub baths until your health care provider says it is okay.  Remove throw rugs and tripping hazards from the floor.  Wear elastic stockings for as long as your health care provider instructs you to.  Keep all follow-up visits as told by your health care provider. This is important. Range of motion and strengthening exercises Do your exercises as told by your health care provider or physical therapist. Before you exercise   Put a towel between your thigh and a heat pack or heating  pad.  Leave the heat on your thigh muscle for 20-30 minutes before you exercise. Leg lifts  While your knee is still in a splint or a cast, you can do straight-leg raises. Repeat this exercise 10-20 times, 2-3 times per day. As your knee gets better, do this exercise against resistance. 1. Lie flat on your back. 2. Lift the leg about 6 inches. Keep it raised for 3 seconds. 3. Slowly lower the leg. Quad sets  Repeat this exercise 10-20 times every hour. 1. Lie flat on your back. 2. Tighten your thigh muscle (quad). 3. Keep the muscle tight for 5-10 seconds. Hamstring sets  Repeat this exercise 10-20 times every hour. 1. Push your foot backward against an object that does not move. 2. Keep pushing your foot against it for 5-10 seconds. Weight-resistance exercises  Weight-resistance exercises are another important part of rehabilitation. These exercises strengthen your muscles by making them work against resistance. Examples include using:  Free weights.  Weight-lifting machines.  Resistance bands. Aerobic exercises  Aerobic exercise keeps joints and muscles moving. It involves large muscle groups. It is also rhythmic in nature and is done for a longer period. Doing these exercises improves circulation and endurance. Your health care provider may have you start by taking a 20-30 minute walk, 2 times per day. Examples of aerobic exercise include:  Swimming.  Walking.  Hiking.  Jogging.  Cross-country skiing.  Bike riding. This information is not intended to replace advice given to you by your health care  provider. Make sure you discuss any questions you have with your health care provider. Document Released: 10/08/2005 Document Revised: 06/12/2016 Document Reviewed: 10/04/2014 Elsevier Interactive Patient Education  2017 [employer].

## 2017-03-05 NOTE — Progress Notes (Signed)
Patient ID: Jay Keith, male   DOB: [birth year], 26 y.o.   MRN: [id number]  Chief Complaint  Patient presents with  . Follow-up    RIGHT KNEE    HPI  Review of Systems  Musculoskeletal: Positive for joint pain.  Neurological: Negative for tingling.      There were no vitals taken for this visit. Gen. appearance is normal grooming and hygiene Orientation to person place and time normal Mood normal Gait is ABNORMAL REQUIRES CRUTCHES No peripheral edema or swelling is noted in the RIGHT OR LEFT ANKLE  Sensory exam shows normal sensation to palpation, pressure and soft touch Skin exam no lacerations ulcerations or erythema  Right Knee Exam   Tenderness  The patient is experiencing tenderness in the patella.  Range of Motion  Extension: -15  Flexion: 90   Muscle Strength   The patient has normal right knee strength.  Tests  Lachman:  Anterior - negative     Drawer:       Anterior - negative      Other  Erythema: absent Scars: absent Sensation: normal Pulse: present Swelling: none   Left Knee Exam   Range of Motion  The patient has normal left knee ROM.  Muscle Strength   The patient has normal left knee strength.       A/P  Medical decision-making  Encounter Diagnosis  Name Primary?  . Knee stiffness, right Yes   KNEE EXERCISES X 2 WEEKS   Meds ordered this encounter  Medications  . ibuprofen (ADVIL,MOTRIN) 800 MG tablet    Sig: Take 1 tablet (800 mg total) by mouth every 8 (eight) hours as needed.    Dispense:  90 tablet    Refill:  1    Fuller CanadaStanley Skiler Tye, MD 03/05/2017 10:17 AM

## 2017-03-08 ENCOUNTER — Encounter (HOSPITAL_COMMUNITY): Payer: Self-pay | Admitting: Emergency Medicine

## 2017-03-08 ENCOUNTER — Emergency Department (HOSPITAL_COMMUNITY)
Admission: EM | Admit: 2017-03-08 | Discharge: 2017-03-08 | Disposition: A | Discharge status: Home / Self Care | Payer: Self-pay | Attending: Emergency Medicine | Admitting: Emergency Medicine

## 2017-03-08 DIAGNOSIS — J029 Acute pharyngitis, unspecified: Secondary | ICD-10-CM | POA: Insufficient documentation

## 2017-03-08 DIAGNOSIS — F17200 Nicotine dependence, unspecified, uncomplicated: Secondary | ICD-10-CM | POA: Insufficient documentation

## 2017-03-08 LAB — RAPID STREP SCREEN (MED CTR MEBANE ONLY): Streptococcus, Group A Screen (Direct): NEGATIVE

## 2017-03-08 MED ORDER — DEXAMETHASONE SODIUM PHOSPHATE 10 MG/ML IJ SOLN
10.0000 mg | Freq: Once | INTRAMUSCULAR | Status: DC
  Filled 2017-03-08: qty 1

## 2017-03-08 MED ORDER — ACETAMINOPHEN 325 MG PO TABS
650.0000 mg | ORAL_TABLET | Freq: Once | ORAL | Status: AC
  Administered 2017-03-08: 650 mg via ORAL
  Filled 2017-03-08: qty 2

## 2017-03-08 MED ORDER — DEXAMETHASONE SODIUM PHOSPHATE 10 MG/ML IJ SOLN
10.0000 mg | Freq: Once | INTRAMUSCULAR | Status: AC
  Administered 2017-03-08: 10 mg via INTRAMUSCULAR

## 2017-03-08 NOTE — ED Triage Notes (Signed)
Pt c/o sore throat x2 days. Pt reports difficulty eating and swallowing. Pt has tried OTC medication w./ no relief.

## 2017-03-08 NOTE — Discharge Instructions (Signed)
Please use Tylenol as needed for pain and fevers. These use warm saltwater rinses to relieve pain. Please follow up with a primary care provider regarding today's visit in 1 week as needed. Continue drinking at least 8 glasses of water throughout the day.  Get help right away if: Your neck becomes stiff. You drool or are unable to swallow liquids. You vomit or are unable to keep medicines or liquids down. You have severe pain that does not go away with the use of recommended medicines. You have trouble breathing (not caused by a stuffy nose).

## 2017-03-08 NOTE — ED Provider Notes (Signed)
[facility]-EMERGENCY DEPT Provider Note   CSN: [id number] Arrival date & time: 03/08/17  1756  By signing my name below, I, Sonum Patel, attest that this documentation has been prepared under the direction and in the presence of [address]Franc[address] Manuel Carl[place], [place]. Electronically Signed: Sonum Allena KatzPatel, Scribe. 03/08/17. 6:41 PM.  History   Chief Complaint Chief Complaint  Patient presents with  . Sore Throat    The history is provided by the patient. No language interpreter was used.     HPI Comments: Jay Keith is a 26 y.o. male who presents to the Emergency Department complaining of gradual onset, constant, gradually worsening sore throat that began yesterday. He has associated left ear pain, mild cough, chills and 1 episode of vomiting last night. He has tried ibuprofen, Tylenol, honey, saltwater gargles, hot tea with mild transient relief. He denies nasal congestion, inability to swallow. He denies sick contacts.   History reviewed. No pertinent past medical history.  There are no active problems to display for this patient.   History reviewed. No pertinent surgical history.     Home Medications    Prior to Admission medications   Medication Sig Start Date End Date Taking? Authorizing Provider  HYDROcodone-acetaminophen (NORCO/VICODIN) 5-325 MG tablet Take 1 tablet by mouth every 6 (six) hours as needed for moderate pain. 02/04/17   Vickki HearingHarrison, Stanley E, MD  ibuprofen (ADVIL,MOTRIN) 800 MG tablet Take 1 tablet (800 mg total) by mouth every 8 (eight) hours as needed. 03/05/17   Vickki HearingHarrison, Stanley E, MD    Family History No family history on file.  Social History Social History  Substance Use Topics  . Smoking status: Light Tobacco Smoker  . Smokeless tobacco: Never Used     Comment: occasional  . Alcohol use Yes     Allergies   Patient has no known allergies.   Review of Systems Review of Systems  Constitutional: Positive for chills.  HENT: Positive for ear pain and sore  throat. Negative for congestion and trouble swallowing.   Respiratory: Positive for cough.   Gastrointestinal: Positive for vomiting.     Physical Exam Updated Vital Signs BP (!) 162/87 (BP Location: Left Arm)   Pulse 76   Temp (!) 100.4 F (38 C) (Oral)   Resp 18   Ht 6\' 3"  (1.905 m)   Wt 200 lb (90.7 kg)   SpO2 96%   BMI 25.00 kg/m   Physical Exam  Constitutional: He is oriented to person, place, and time. He appears well-developed and well-nourished.  Well appearing. Airway intact. No drooling, no trismus, no stridor. Handling secretions well  HENT:  Head: Normocephalic and atraumatic.  Right Ear: External ear normal.  Left Ear: External ear normal.  Nose: Nose normal.  Mouth/Throat: Oropharyngeal exudate and posterior oropharyngeal edema present. Tonsillar exudate.  TM's appear normal with no evidence of bulging. EAC appear non erythematous and not swollen  Eyes: Conjunctivae and EOM are normal. Pupils are equal, round, and reactive to light.  Neck: Normal range of motion. No JVD present.  Normal ROM. No nuchal rigidity.   Cardiovascular: Normal rate, regular rhythm and normal heart sounds.   Pulmonary/Chest: Effort normal and breath sounds normal. No stridor. No respiratory distress. He has no wheezes. He has no rales.  Normal work of breathing. No respiratory distress noted.   Abdominal: Soft. He exhibits no distension and no mass. There is no tenderness. There is no rebound and no guarding.  Soft and nontender. No rebound. No guarding. Negative murphy's sign.  No focal tenderness at McBurney's point. No CVA tenderness. No evidence of hernia  Musculoskeletal: Normal range of motion.  Lymphadenopathy:    He has cervical adenopathy.  Neurological: He is alert and oriented to person, place, and time.  Skin: Skin is warm.  Psychiatric: He has a normal mood and affect. His behavior is normal.  Nursing note and vitals reviewed.    ED Treatments / Results  DIAGNOSTIC  STUDIES: Oxygen Saturation is 96% on room air, normal by my interpretation.    COORDINATION OF CARE: 6:39 PM Discussed treatment plan with pt at bedside and pt agreed to plan.   Labs (all labs ordered are listed, but only abnormal results are displayed) Labs Reviewed  RAPID STREP SCREEN (NOT AT [facility])  CULTURE, GROUP A STREP [facility])    EKG  EKG Interpretation None       Radiology No results found.  Procedures Procedures (including critical care time)  Medications Ordered in ED Medications  acetaminophen (TYLENOL) tablet 650 mg (650 mg Oral Given 03/08/17 2006)  dexamethasone (DECADRON) injection 10 mg (10 mg Intramuscular Given 03/08/17 2007)     Initial Impression / Assessment and Plan / ED Course  I have reviewed the triage vital signs and the nursing notes.  Pertinent labs & imaging results that were available during my care of the patient were reviewed by me and considered in my medical decision making (see chart for details).    Pt with negative strep. Diagnosis of viral pharyngitis. No abx indicated at this time. Discussed that results of strep culture are pending and patient will be informed if positive result and abx will be called in at that time. Discharge with symptomatic tx. No evidence of dehydration. Pt is tolerating secretions. Presentation not concerning for peritonsillar abscess or spread of infection to deep spaces of the throat; patent airway. Specific return precautions discussed. Recommended PCP follow up. Pt appears safe for discharge.   Final Clinical Impressions(s) / ED Diagnoses   Final diagnoses:  Viral pharyngitis    New Prescriptions New Prescriptions   No medications on file   I personally performed the services described in this documentation, which was scribed in my presence. The recorded information has been reviewed and is accurate.   Candie Mile [place], [place] 03/08/17 2019    Tilden Fossa, MD 03/09/17 [phone]

## 2017-03-09 ENCOUNTER — Emergency Department (HOSPITAL_COMMUNITY)
Admission: EM | Admit: 2017-03-09 | Discharge: 2017-03-09 | Disposition: A | Discharge status: Home / Self Care | Payer: Self-pay | Attending: Emergency Medicine | Admitting: Emergency Medicine

## 2017-03-09 ENCOUNTER — Encounter (HOSPITAL_COMMUNITY): Payer: Self-pay | Admitting: Emergency Medicine

## 2017-03-09 DIAGNOSIS — F17200 Nicotine dependence, unspecified, uncomplicated: Secondary | ICD-10-CM | POA: Insufficient documentation

## 2017-03-09 DIAGNOSIS — J039 Acute tonsillitis, unspecified: Secondary | ICD-10-CM | POA: Insufficient documentation

## 2017-03-09 DIAGNOSIS — Z79899 Other long term (current) drug therapy: Secondary | ICD-10-CM | POA: Insufficient documentation

## 2017-03-09 MED ORDER — AMOXICILLIN 500 MG PO CAPS: 500.0000 mg | ORAL_CAPSULE | Freq: Three times a day (TID) | ORAL | 0 refills | Status: AC

## 2017-03-09 MED ORDER — HYDROCODONE-ACETAMINOPHEN 5-325 MG PO TABS: 2.0000 | ORAL_TABLET | ORAL | 0 refills | Status: AC | PRN

## 2017-03-09 NOTE — ED Triage Notes (Signed)
Pt reports he was treated for strep throat yesterday with IM abx. Felt well for several hours after discharge. But then throat pain returned and is worse than before. Pain worse with movement.

## 2017-03-09 NOTE — ED Provider Notes (Signed)
[facility]-EMERGENCY DEPT Provider Note   CSN: [id number] Arrival date & time: 03/09/17  1843  By signing my name below, I, Jay Keith, attest that this documentation has been prepared under the direction and in the presence of Ok Edwards, [place]. Electronically Signed: Diona Keith, ED Scribe. 03/09/17. 7:21 PM.   History   Chief Complaint Chief Complaint  Patient presents with  . Sore Throat    HPI Jay Keith is a 26 y.o. male who presents to the Emergency Department complaining of a gradually worsening sore throat that started on 03/07/17. Associated sx include subjective fever, hard time swallowing, and pain when speaking. Pt was seen in the ED yesterday for same sx. He was given a shot in the buttocks, which temporarily alleviated his discomfort. He took 2 tylenol ~ 2 pm today with mild to no relief. Pt denies cough, Ha and any other sx at this time.   The history is provided by the patient and medical records. No language interpreter was used.    History reviewed. No pertinent past medical history.  There are no active problems to display for this patient.   History reviewed. No pertinent surgical history.     Home Medications    Prior to Admission medications   Medication Sig Start Date End Date Taking? Authorizing Provider  HYDROcodone-acetaminophen (NORCO/VICODIN) 5-325 MG tablet Take 1 tablet by mouth every 6 (six) hours as needed for moderate pain. 02/04/17   Vickki Hearing, MD  ibuprofen (ADVIL,MOTRIN) 800 MG tablet Take 1 tablet (800 mg total) by mouth every 8 (eight) hours as needed. 03/05/17   Vickki Hearing, MD    Family History History reviewed. No pertinent family history.  Social History Social History  Substance Use Topics  . Smoking status: Light Tobacco Smoker  . Smokeless tobacco: Never Used     Comment: occasional  . Alcohol use Yes     Allergies   Patient has no known allergies.   Review of Systems Review of Systems    Constitutional: Positive for fever.  HENT: Positive for sore throat and trouble swallowing.   Respiratory: Negative for cough.   Neurological: Negative for headaches.  All other systems reviewed and are negative.    Physical Exam Updated Vital Signs BP (!) 150/97 (BP Location: Right Arm)   Pulse 81   Temp 98.8 F (37.1 C) (Oral)   Resp 16   SpO2 97%   Physical Exam  Constitutional: He appears well-developed and well-nourished. No distress.  HENT:  Head: Normocephalic and atraumatic.  Erythema in the throat.  Left tonsil slightly more red than right. No sign of peritonsillar abscess.  Eyes: Conjunctivae are normal.  Neck: Normal range of motion.  Cardiovascular: Normal rate.   Pulmonary/Chest: Effort normal.  Abdominal: He exhibits no distension.  Musculoskeletal: Normal range of motion.  Neurological: He is alert.  Skin: No pallor.  Psychiatric: He has a normal mood and affect. His behavior is normal.  Nursing note and vitals reviewed.    ED Treatments / Results  DIAGNOSTIC STUDIES: Oxygen Saturation is 97% on RA, normal by my interpretation.   COORDINATION OF CARE: 7:21 PM-Discussed next steps with pt. Pt verbalized understanding and is agreeable with the plan.    Labs (all labs ordered are listed, but only abnormal results are displayed) Labs Reviewed - No data to display  EKG  EKG Interpretation None       Radiology No results found.  Procedures Procedures (including critical care time)  Medications  Ordered in ED Medications - No data to display   Initial Impression / Assessment and Plan / ED Course  I have reviewed the triage vital signs and the nursing notes.  Pertinent labs & imaging results that were available during my care of the patient were reviewed by me and considered in my medical decision making (see chart for details).       Final Clinical Impressions(s) / ED Diagnoses   Final diagnoses:  Tonsillitis    New  Prescriptions Discharge Medication List as of 03/09/2017  7:31 PM    START taking these medications   Details  amoxicillin (AMOXIL) 500 MG capsule Take 1 capsule (500 mg total) by mouth 3 (three) times daily., Starting Sat 03/09/2017, Print      An After Visit Summary was printed and given to the patient.  Meds ordered this encounter  Medications  . HYDROcodone-acetaminophen (NORCO/VICODIN) 5-325 MG tablet    Sig: Take 2 tablets by mouth every 4 (four) hours as needed.    Dispense:  10 tablet    Refill:  0    Order Specific Question:   Supervising Provider    Answer:   Hyacinth MeekerMILLER, BRIAN [[id number]]  . amoxicillin (AMOXIL) 500 MG capsule    Sig: Take 1 capsule (500 mg total) by mouth 3 (three) times daily.    Dispense:  30 capsule    Refill:  0    Order Specific Question:   Supervising Provider    Answer:   Eber HongMILLER, BRIAN [[id number]]       Elson AreasSofia, Jonae Renshaw K, PA-C 03/09/17 2045    Jacalyn LefevreHaviland, Julie, MD 03/10/17 [phone]

## 2017-03-11 LAB — CULTURE, GROUP A STREP (THRC)

## 2017-03-20 ENCOUNTER — Encounter: Payer: Self-pay | Admitting: Orthopedic Surgery

## 2017-03-20 ENCOUNTER — Ambulatory Visit (INDEPENDENT_AMBULATORY_CARE_PROVIDER_SITE_OTHER): Payer: Self-pay | Admitting: Orthopedic Surgery

## 2017-03-20 DIAGNOSIS — M25661 Stiffness of right knee, not elsewhere classified: Secondary | ICD-10-CM

## 2017-03-20 DIAGNOSIS — M238X1 Other internal derangements of right knee: Secondary | ICD-10-CM

## 2017-03-20 NOTE — Progress Notes (Signed)
Progress Note   Patient ID: Jay Keith, male   DOB: [birth year], 26 y.o.   MRN: [id number]  Chief Complaint  Patient presents with  . Follow-up    right knee sprain    HPI Jay OvensGuy Tangonan is a 26 y.o. male.   26 year old male injured his knee on April 6. It was unclear whether he had an anterior cruciate ligament tear or patellofemoral dislocation we treated him conservatively and did not get an MRI because he was uninsured.  He comes in today complaining of mild medial knee pain but improved range of motion and decreasing pain.       Review of Systems Review of Systems Normal neuro  Denies fever  Examination There were no vitals taken for this visit.  Gen. appearance the patient's appearance is normal with normal grooming and  hygiene The patient is oriented to person place and time Mood and affect are normal   Left Knee Exam   Tenderness  Left knee tenderness location: Medial femoral condyle.  Range of Motion  Extension: -5  Flexion: 120   Tests  Lachman:  Anterior - negative      Valgus: positive Patellar Apprehension: negative  Other  Erythema: absent Sensation: normal Pulse: present Swelling: none Effusion: no effusion present  Comments:  His valgus stress test was positive for pain with no opening     Stability tests are normal  Motor exam 5/5 manual muscle testing , no atrophy  Skin is normal (no rash or erythema)    Medical decision-making Diagnosis, Data, Plan (risk)  Encounter Diagnoses  Name Primary?  . Knee stiffness, right   . Mcl deficiency, knee, right Yes     I think he had an MCL sprain probably a grade 1 he has improved he just needs to get his range of motion back and he should be fine in 4-6 weeks  Fuller CanadaStanley Judine Arciniega, MD 03/20/2017 2:44 PM

## 2017-03-20 NOTE — Progress Notes (Deleted)
Patient ID: Jay Keith, male   DOB: Sep 21, 1991, 26 y.o.   MRN: [id number]  Chief Complaint  Patient presents with  . Follow-up    right knee sprain    HPI Jay Keith is a 26 y.o. male.  ***  Review of Systems Review of Systems   No past medical history on file.  No past surgical history on file.  No family history on file.  Social History Social History  Substance Use Topics  . Smoking status: Light Tobacco Smoker  . Smokeless tobacco: Never Used     Comment: occasional  . Alcohol use Yes    No Known Allergies  Current Outpatient Prescriptions  Medication Sig Dispense Refill  . amoxicillin (AMOXIL) 500 MG capsule Take 1 capsule (500 mg total) by mouth 3 (three) times daily. 30 capsule 0  . HYDROcodone-acetaminophen (NORCO/VICODIN) 5-325 MG tablet Take 2 tablets by mouth every 4 (four) hours as needed. 10 tablet 0  . ibuprofen (ADVIL,MOTRIN) 800 MG tablet Take 1 tablet (800 mg total) by mouth every 8 (eight) hours as needed. 90 tablet 1   No current facility-administered medications for this visit.      Physical Exam There were no vitals taken for this visit. Physical Exam The patient is well developed well nourished and well groomed.  Orientation to person place and time is normal  Mood is pleasant.  Ambulatory status ***  *** Upper extremity examination reveals the following:  Inspection reveals no swelling. There is tenderness over the carpal tunnel.  Range of motion of the wrist and elbow are normal  Motor exam shows mild weakness with grip strength.  Wrist joint is stable  Provocative tests for carpal tunnel Phalen's test  *** Carpal tunnel compression test ***  Pulses are normal in the radial and ulnar artery with a normal Allen's test.  Decreased sensation is noted in the median nerve distribution. Soft touch is normal.  Opposite extremity  Data Reviewed  independent image interpretation :  ***  Assessment    *** CTS    Plan     *** Wrist splint Gabapentin  Vit B 6  NCS  F/U ***

## 2018-02-12 IMAGING — DX DG KNEE COMPLETE 4+V*R*
5 series · 5 of 5 positions shown · non-contrast
Comparison: None.

CLINICAL DATA: Fall, basketball injury, right knee pain

EXAM:
RIGHT KNEE - COMPLETE 4+ VIEW

[knee ap (1 of 3)]
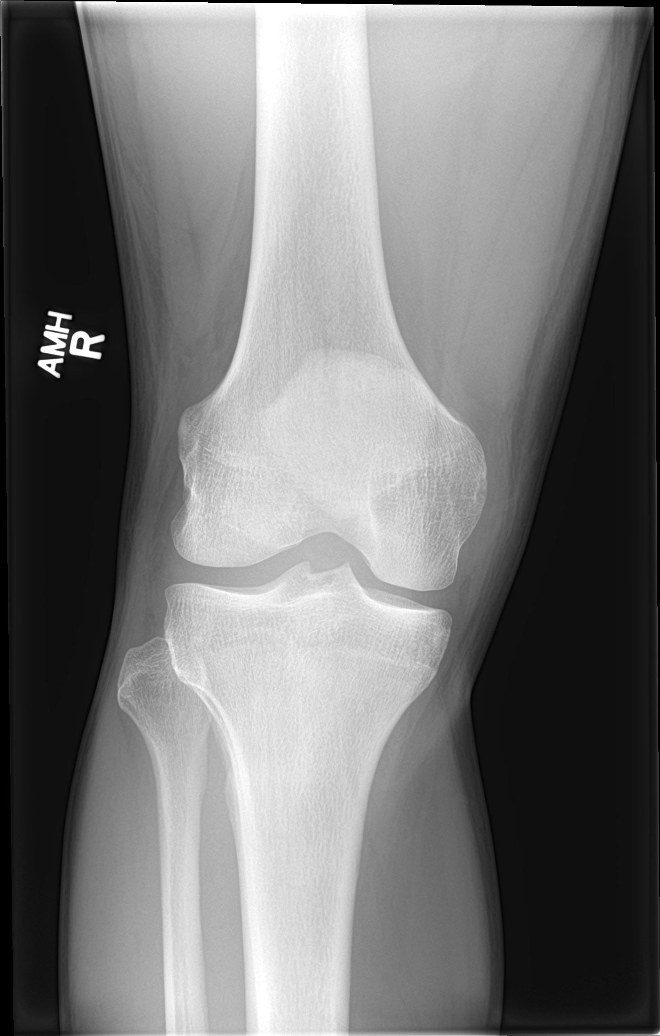

[knee lat (1 of 2)]
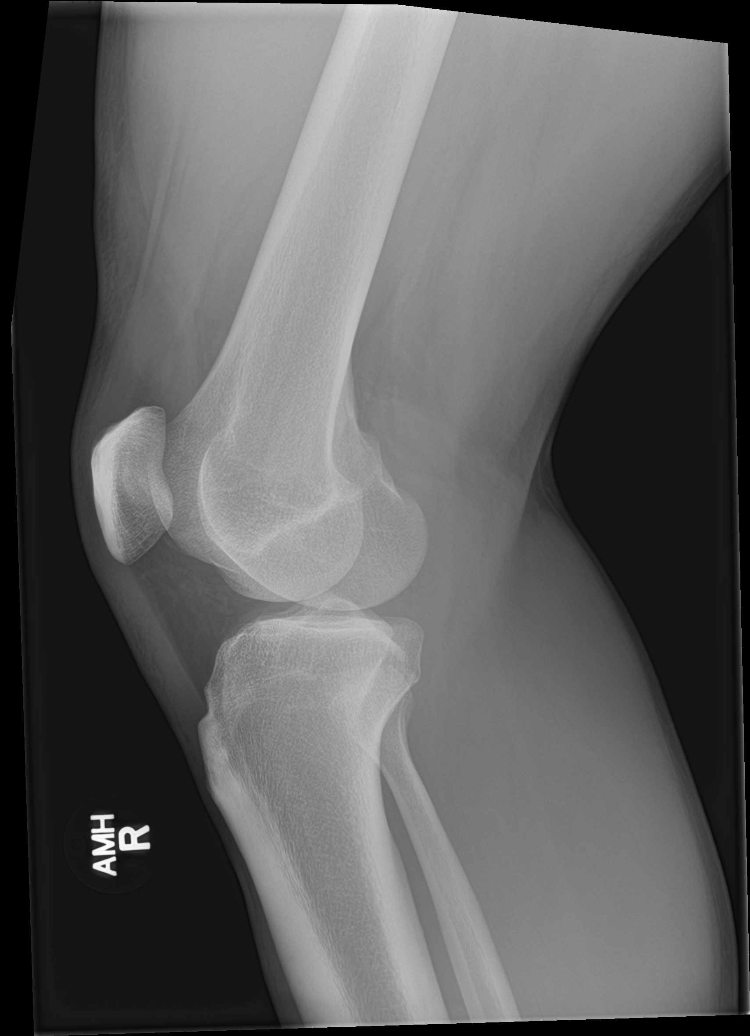

[knee ap (2 of 3)]
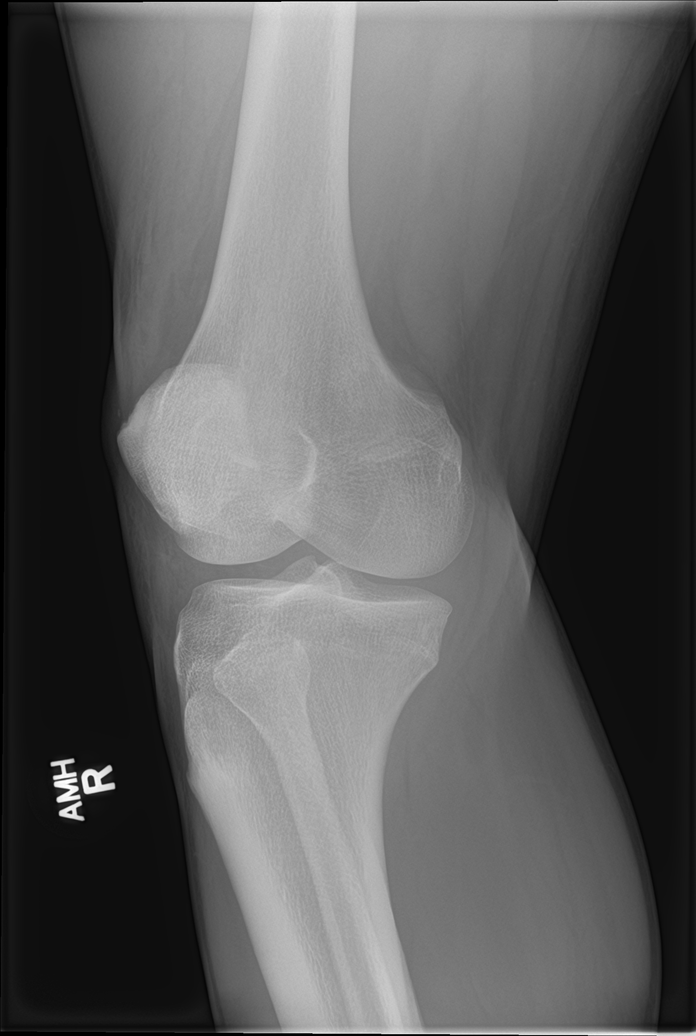

[knee ap (3 of 3)]
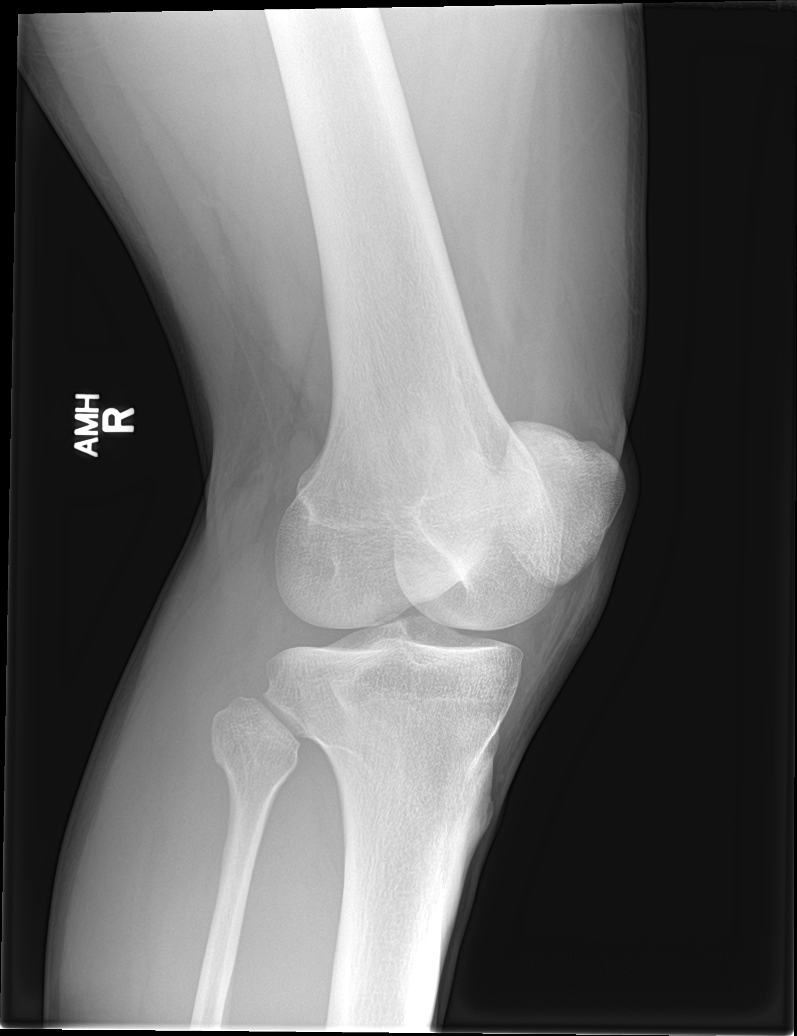

[knee lat (2 of 2)]
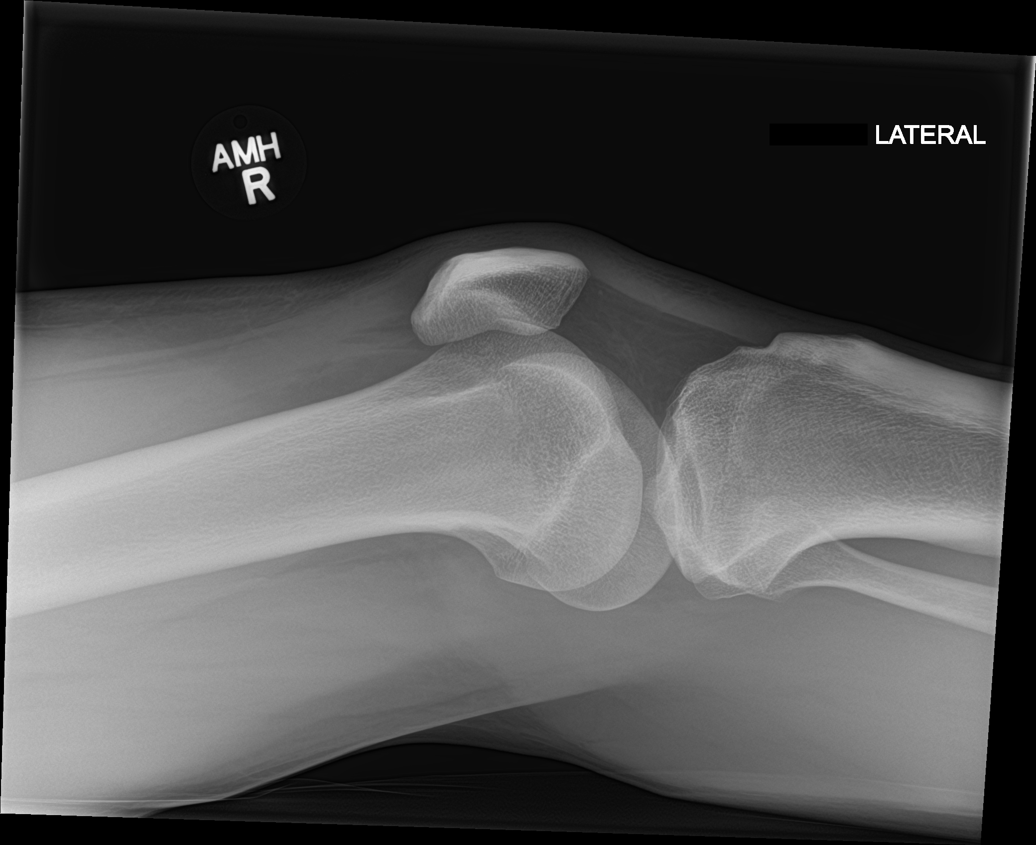

[5 of 5 positions shown; findings below may reference images not displayed]

FINDINGS: No fracture or dislocation is seen.

The joint spaces are preserved.

Small suprapatellar knee joint effusion.
IMPRESSION: No fracture or dislocation is seen.

Small suprapatellar knee joint effusion.

## 2024-06-12 ENCOUNTER — Ambulatory Visit
Admission: EM | Admit: 2024-06-12 | Discharge: 2024-06-12 | Disposition: A | Discharge status: Home / Self Care | Payer: Self-pay | Attending: Family Medicine | Admitting: Family Medicine

## 2024-06-12 DIAGNOSIS — J069 Acute upper respiratory infection, unspecified: Secondary | ICD-10-CM

## 2024-06-12 LAB — POC SOFIA SARS ANTIGEN FIA: SARS Coronavirus 2 Ag: NEGATIVE

## 2024-06-12 MED ORDER — PROMETHAZINE-DM 6.25-15 MG/5ML PO SYRP: 5.0000 mL | ORAL_SOLUTION | Freq: Four times a day (QID) | ORAL | 0 refills | Status: AC | PRN

## 2024-06-12 MED ORDER — AZELASTINE HCL 0.1 % NA SOLN: 1.0000 | Freq: Two times a day (BID) | NASAL | 0 refills | Status: AC

## 2024-06-12 NOTE — ED Provider Notes (Signed)
 [facility]    CSN: [id number] Arrival date & time: 06/12/24  1524      History   Chief Complaint No chief complaint on file.   HPI Jay Keith is a 33 y.o. male.   Patient presenting today with 4-day history of cough, congestion, loss of taste and smell, fatigue.  Denies chest pain, shortness of breath, abdominal pain, vomiting, diarrhea.  So far trying Claritin and sinus medication with minimal relief.  No known history of chronic pulmonary disease.    History reviewed. No pertinent past medical history.  There are no active problems to display for this patient.   History reviewed. No pertinent surgical history.     Home Medications    Prior to Admission medications   Medication Sig Start Date End Date Taking? Authorizing Provider  azelastine  (ASTELIN ) 0.1 % nasal spray Place 1 spray into both nostrils 2 (two) times daily. Use in each nostril as directed 06/12/24  Yes Stuart Vernell Norris, PA-C  promethazine -dextromethorphan (PROMETHAZINE -DM) 6.25-15 MG/5ML syrup Take 5 mLs by mouth 4 (four) times daily as needed. 06/12/24  Yes Stuart Vernell Norris, PA-C  amoxicillin  (AMOXIL ) 500 MG capsule Take 1 capsule (500 mg total) by mouth 3 (three) times daily. 03/09/17   Sofia, Leslie K, PA-C  HYDROcodone -acetaminophen  (NORCO/VICODIN) 5-325 MG tablet Take 2 tablets by mouth every 4 (four) hours as needed. 03/09/17   Sofia, Leslie K, PA-C  ibuprofen  (ADVIL ,MOTRIN ) 800 MG tablet Take 1 tablet (800 mg total) by mouth every 8 (eight) hours as needed. 03/05/17   Margrette Taft BRAVO, MD    Family History History reviewed. No pertinent family history.  Social History Social History   Tobacco Use   Smoking status: Light Smoker   Smokeless tobacco: Never   Tobacco comments:    occasional  Vaping Use   Vaping status: Never Used  Substance Use Topics   Alcohol use: Yes   Drug use: Yes    Types: Marijuana     Allergies   Patient has no known allergies.   Review  of Systems Review of Systems Per HPI  Physical Exam Triage Vital Signs ED Triage Vitals [06/12/24 1548]  Encounter Vitals Group     BP 112/68     Girls Systolic BP Percentile      Girls Diastolic BP Percentile      Boys Systolic BP Percentile      Boys Diastolic BP Percentile      Pulse Rate 78     Resp 16     Temp 97.9 F (36.6 C)     Temp Source Oral     SpO2 96 %     Weight      Height      Head Circumference      Peak Flow      Pain Score 0     Pain Loc      Pain Education      Exclude from Growth Chart    No data found.  Updated Vital Signs BP 112/68 (BP Location: Right Arm)   Pulse 78   Temp 97.9 F (36.6 C) (Oral)   Resp 16   SpO2 96%   Visual Acuity Right Eye Distance:   Left Eye Distance:   Bilateral Distance:    Right Eye Near:   Left Eye Near:    Bilateral Near:     Physical Exam Vitals and nursing note reviewed.  Constitutional:      Appearance: Normal appearance. He is well-developed.  HENT:     Head: Atraumatic.     Right Ear: External ear normal.     Left Ear: External ear normal.     Nose: Rhinorrhea present.     Mouth/Throat:     Pharynx: Posterior oropharyngeal erythema present. No oropharyngeal exudate.  Eyes:     Extraocular Movements: Extraocular movements intact.     Conjunctiva/sclera: Conjunctivae normal.     Pupils: Pupils are equal, round, and reactive to light.  Cardiovascular:     Rate and Rhythm: Normal rate and regular rhythm.  Pulmonary:     Effort: Pulmonary effort is normal. No respiratory distress.     Breath sounds: Normal breath sounds. No wheezing or rales.  Musculoskeletal:        General: Normal range of motion.     Cervical back: Normal range of motion and neck supple.  Lymphadenopathy:     Cervical: No cervical adenopathy.  Skin:    General: Skin is warm and dry.  Neurological:     General: No focal deficit present.     Mental Status: He is alert and oriented to person, place, and time.  Psychiatric:         Mood and Affect: Mood normal.        Behavior: Behavior normal.        Thought Content: Thought content normal.        Judgment: Judgment normal.      UC Treatments / Results  Labs (all labs ordered are listed, but only abnormal results are displayed) Labs Reviewed  POC SOFIA SARS ANTIGEN FIA    EKG   Radiology No results found.  Procedures Procedures (including critical care time)  Medications Ordered in UC Medications - No data to display  Initial Impression / Assessment and Plan / UC Course  I have reviewed the triage vital signs and the nursing notes.  Pertinent labs & imaging results that were available during my care of the patient were reviewed by me and considered in my medical decision making (see chart for details).     Vitals and exam reassuring today and suggestive of a viral respiratory infection.  Rapid COVID-negative.  Treat with Phenergan  DM, Astelin , supportive over-the-counter medications at home care.  Return for worsening symptoms. Final Clinical Impressions(s) / UC Diagnoses   Final diagnoses:  Viral URI with cough   Discharge Instructions   None    ED Prescriptions     Medication Sig Dispense Auth. Provider   azelastine  (ASTELIN ) 0.1 % nasal spray Place 1 spray into both nostrils 2 (two) times daily. Use in each nostril as directed 30 mL Stuart Vernell Norris, PA-C   promethazine -dextromethorphan (PROMETHAZINE -DM) 6.25-15 MG/5ML syrup Take 5 mLs by mouth 4 (four) times daily as needed. 100 mL Stuart Vernell Norris, [place]      PDMP not reviewed this encounter.   Stuart Vernell Norris, [place] 06/12/24 [phone]

## 2024-06-12 NOTE — ED Triage Notes (Signed)
 Pt reports he has a cough, no taste or smell, and fatigue x 4 days  Took claratin and sinex but no relief
